# Patient Record
Sex: Female | Born: 1935 | Race: White | Hispanic: No
Health system: Southern US, Community
[De-identification: ages and names within clinical notes are randomized; demographics above are authoritative.]

---

## 2005-04-05 ENCOUNTER — Ambulatory Visit: Payer: Self-pay | Admitting: Internal Medicine

## 2009-08-09 ENCOUNTER — Inpatient Hospital Stay: Payer: Self-pay | Admitting: Internal Medicine

## 2011-02-17 IMAGING — CR DG CHEST 1V PORT
1 series · 1 of 1 positions shown · non-contrast
Comparison: none

REASON FOR EXAM: cp
COMMENTS:

PROCEDURE:     DXR - DXR PORTABLE CHEST SINGLE VIEW  - August 09, 2009  [DATE]
RESULT:     Comparison: None

[view not recorded]
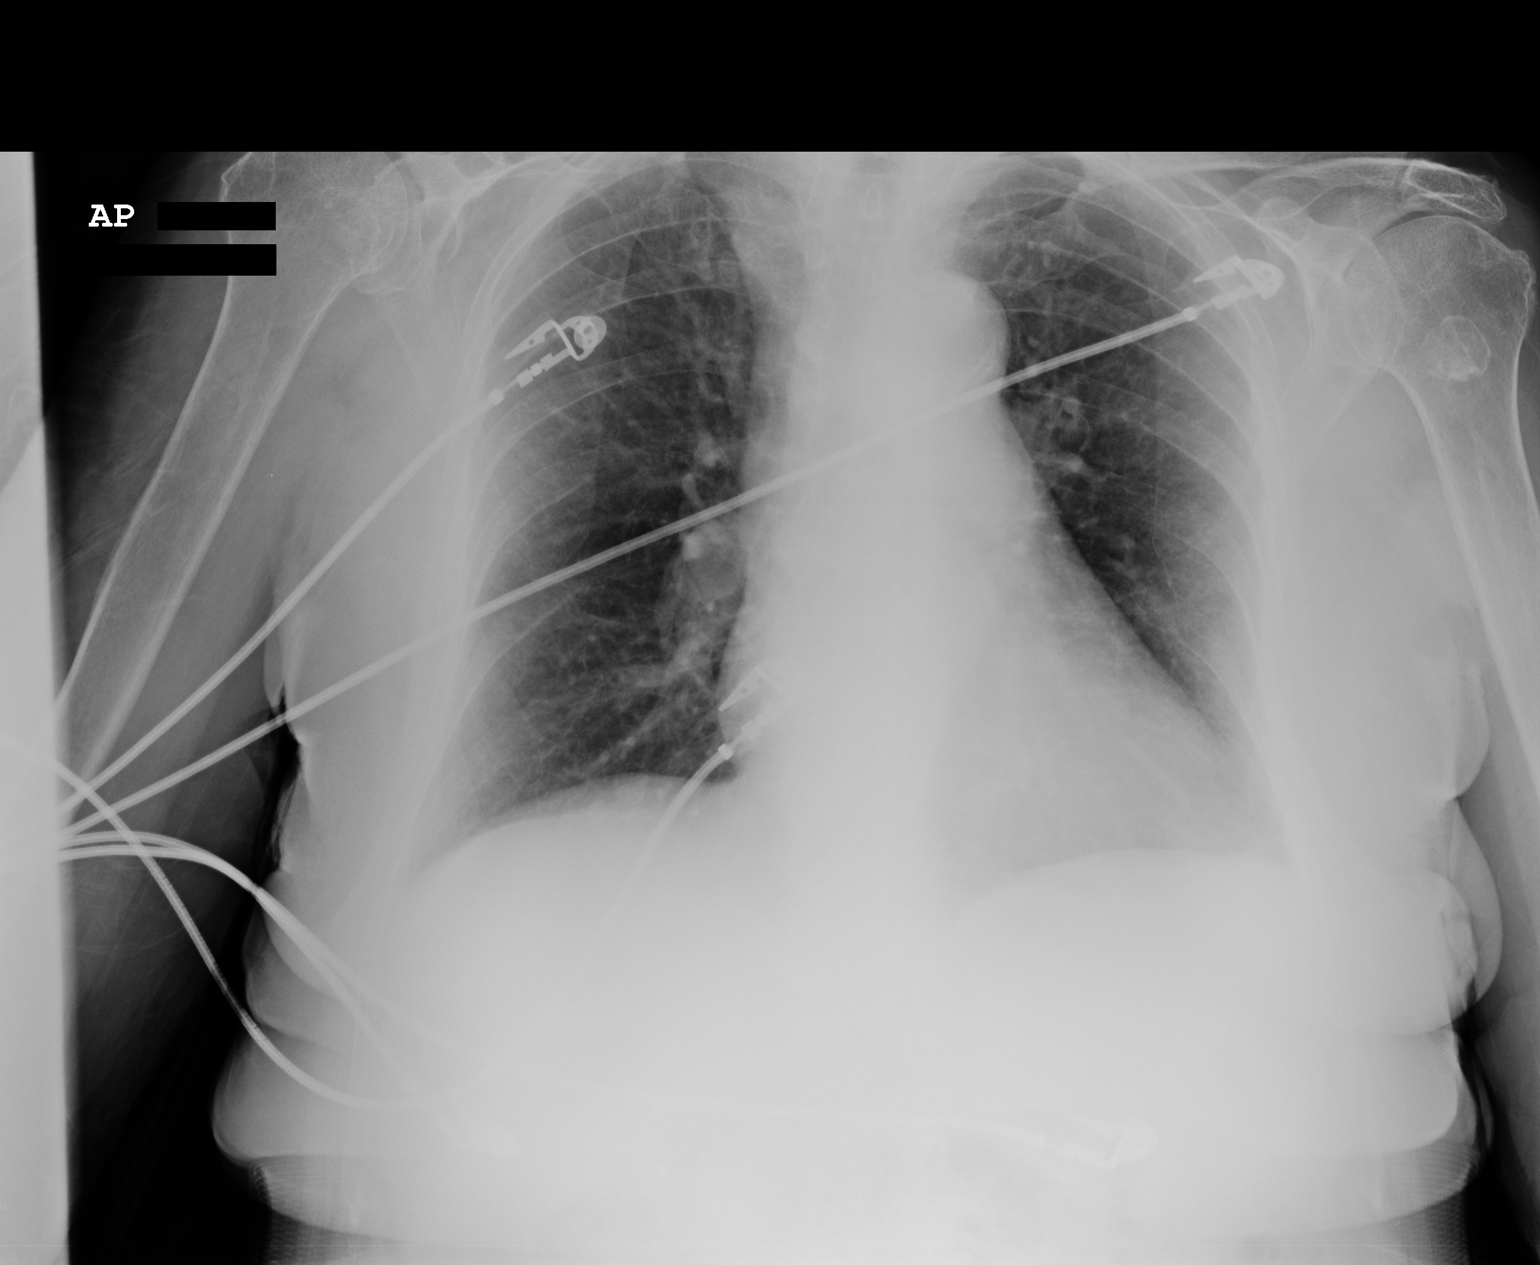

[1 of 1 positions shown; findings below may reference images not displayed]

FINDINGS: Single portable AP chest radiograph is provided. There is no focal
parenchymal opacity, pleural effusion, or pneumothorax. Normal
cardiomediastinal silhouette. The osseous structures are unremarkable.
IMPRESSION: No acute disease of the chest.

## 2011-02-17 IMAGING — CT CT CHEST-ABD W/ CM
1 of 3 series · 14 of 31 positions shown, 19 images · IV contrast (APPLIED)
Comparison: none

REASON FOR EXAM: (1) chest pain radiating to back, eval for dissection;
(2) chest pain radiating
COMMENTS:

[Series 5: soft tissue · axial · 0.65mm/px · z∈[+895,+1255]mm · 14 of 140 slices shown, 19 images]
[im 10/140  mediastinal]
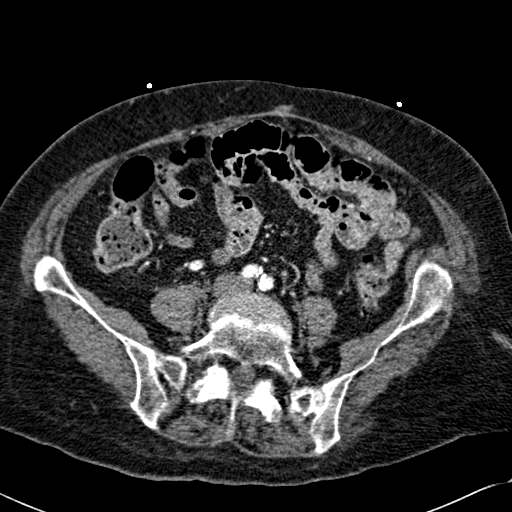
[im 10/140  bone]
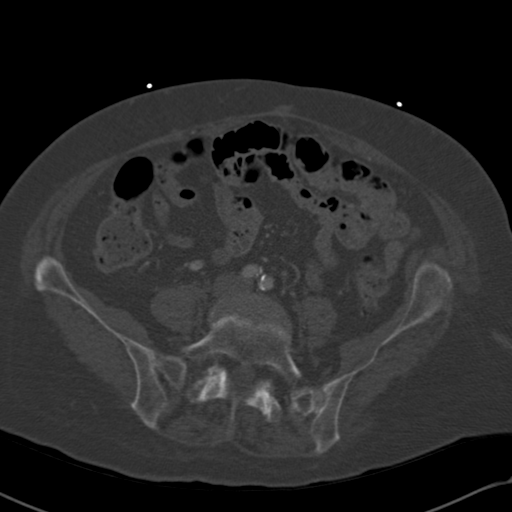
[im 19/140  mediastinal]
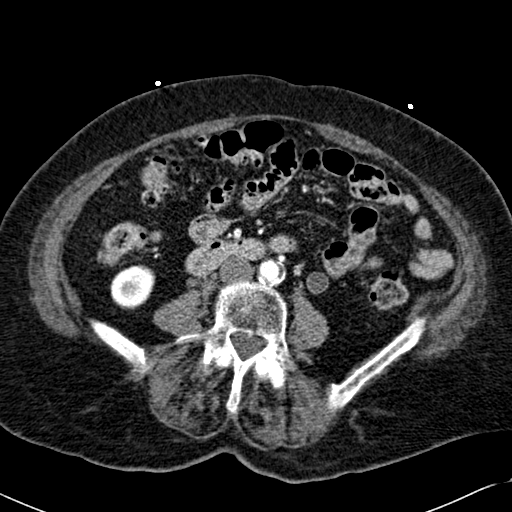
[im 38/140  mediastinal]
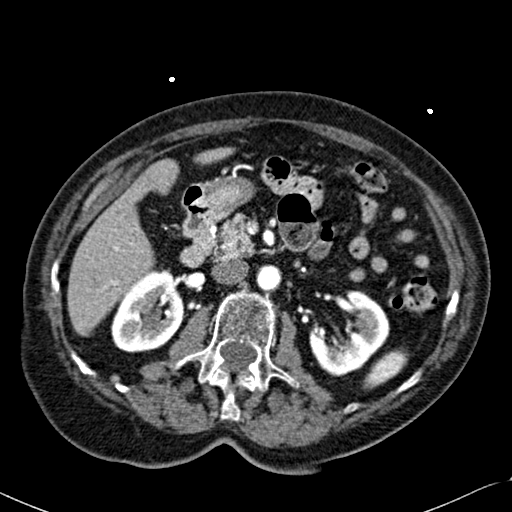
[im 47/140  mediastinal]
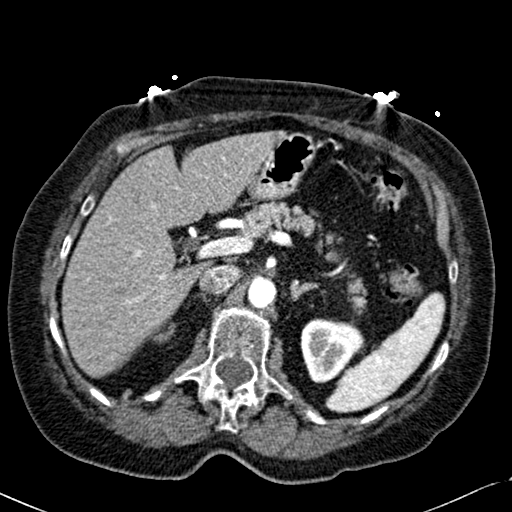
[im 56/140  mediastinal]
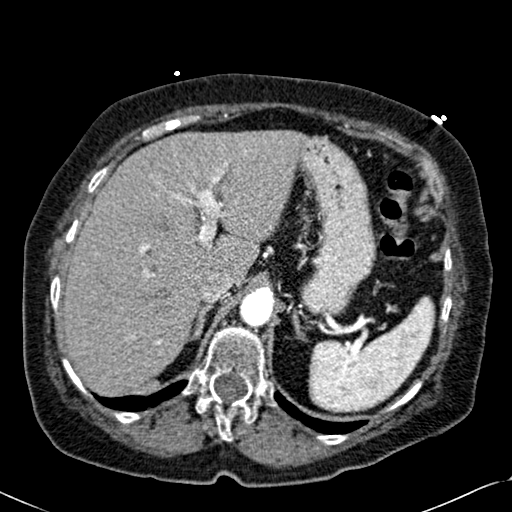
[im 65/140  mediastinal]
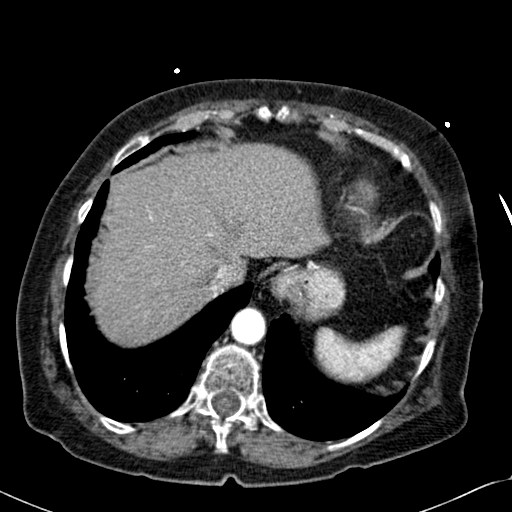
[im 70/140  mediastinal]
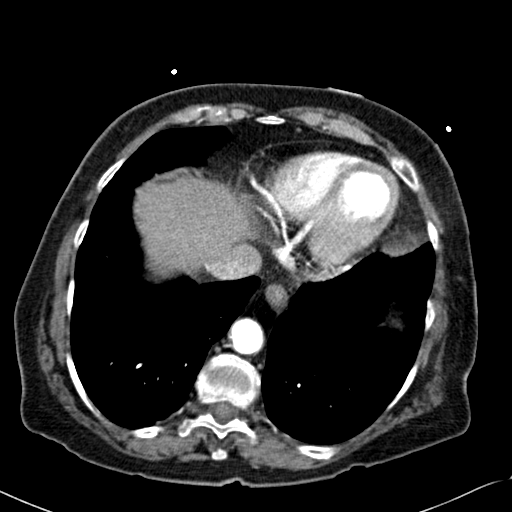
[im 75/140  mediastinal]
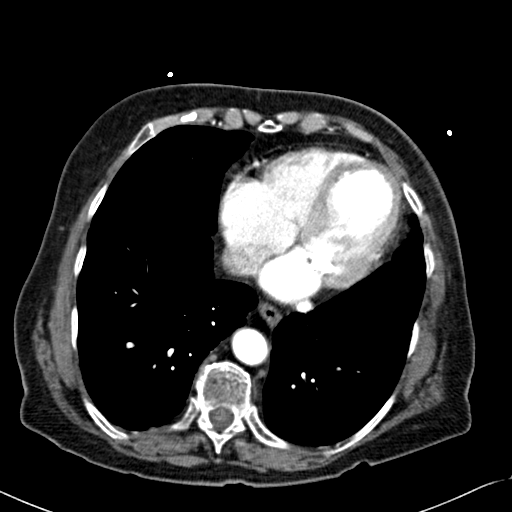
[im 84/140  mediastinal]
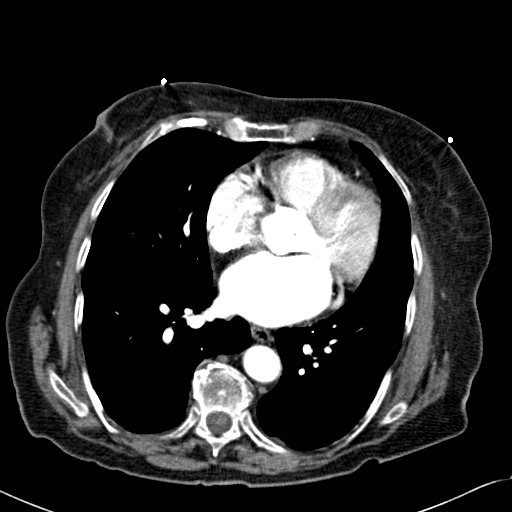
[im 84/140  bone]
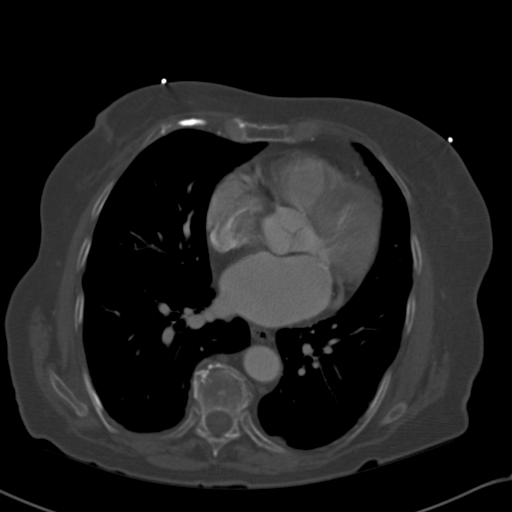
[im 93/140  mediastinal]
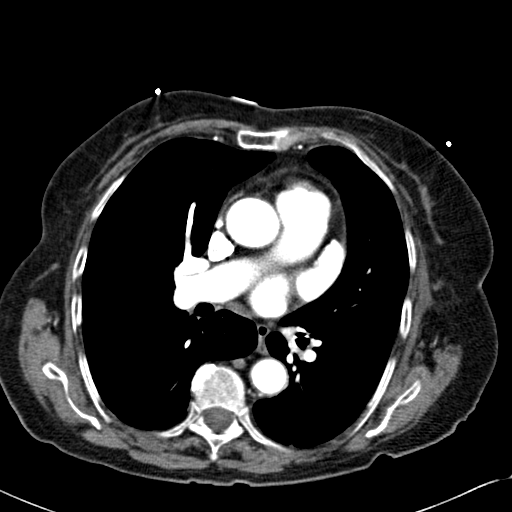
[im 102/140  mediastinal]
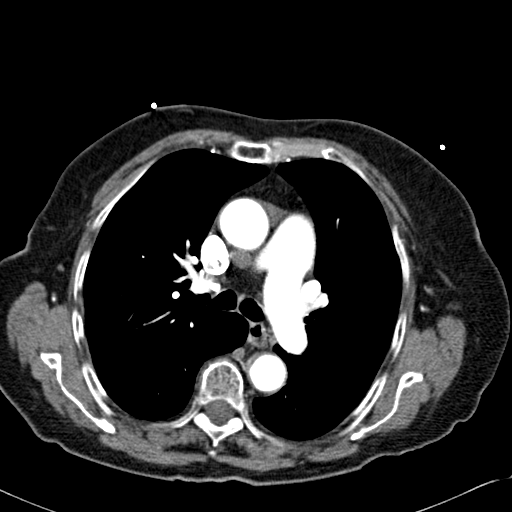
[im 102/140  lung]
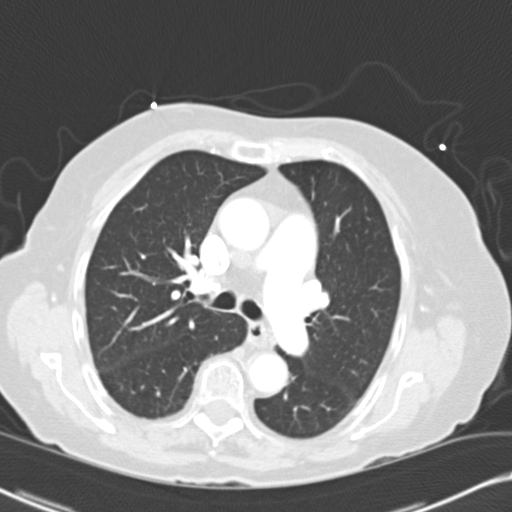
[im 112/140  lung]
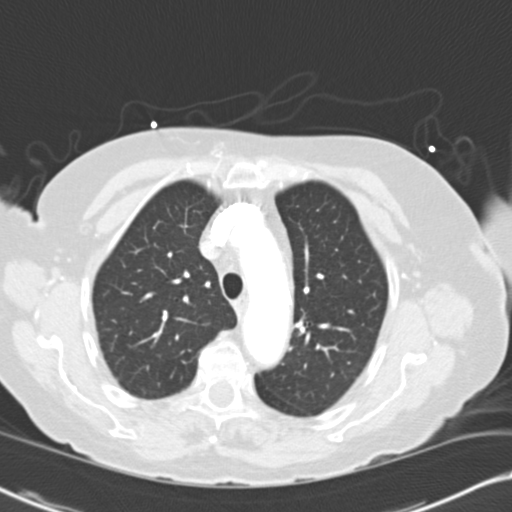
[im 121/140  mediastinal]
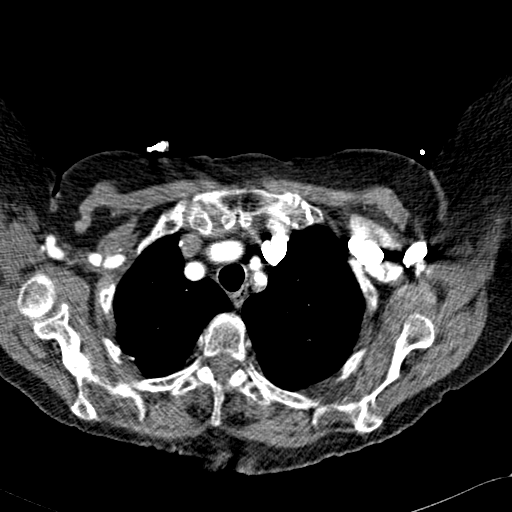
[im 121/140  lung]
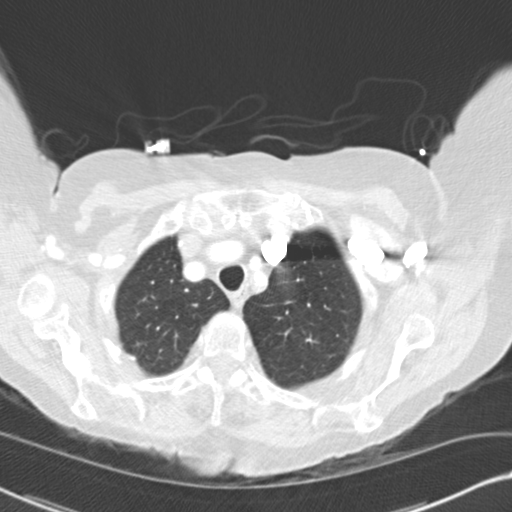
[im 130/140  mediastinal]
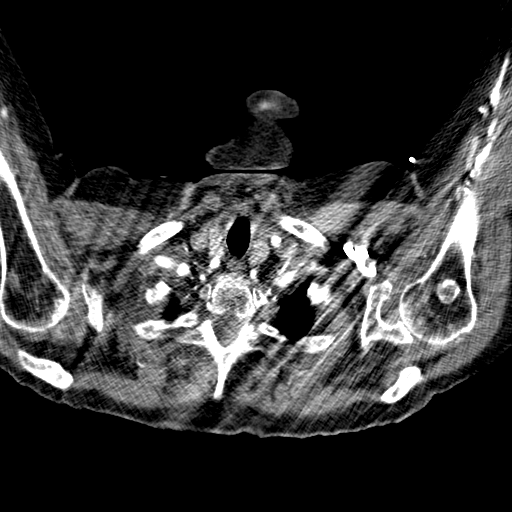
[im 130/140  lung]
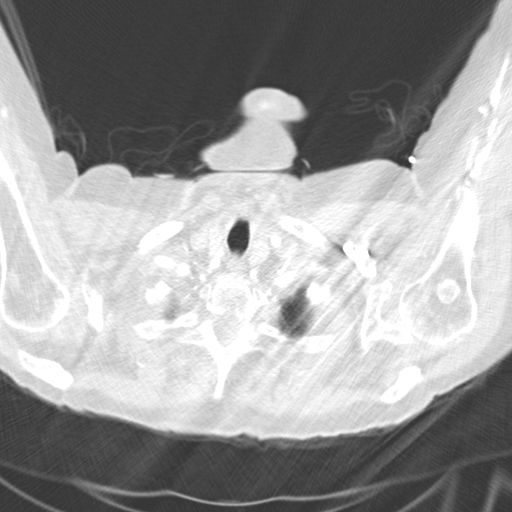

[14 of 31 positions shown; findings below may reference images not displayed]

PROCEDURE:     CT  - CT CHEST AND ABDOMEN W  - August 09, 2009 [DATE]

RESULT:     Axial CT scanning was performed through the chest and abdomen at
3 mm intervals and slice thicknesses following intravenous administration of
85 cc of Tsovue-AW8. Review of 3-dimensional reconstructed images was
performed separately on the webspace server monitor.

The caliber of the thoracic and abdominal aorta is normal. There is no
evidence of a false lumen. There is mural thrombus and calcification in the
infrarenal abdominal aorta. No dissection of this portion of the aorta
extending into the common iliac arteries is seen. The thoracic esophagus is
normal in caliber. There is a small hiatal hernia.

The cardiac chambers are top normal to mildly enlarged. There is no evidence
of a mediastinal hematoma. Contrast within the pulmonary arterial tree is
grossly normal. No pathologic sized mediastinal or hilar lymph nodes are
demonstrated. The retrosternal soft tissues appear normal. At lung window
settings I see no interstitial nor alveolar infiltrates nor evidence of a
pneumothorax. There are emphysematous changes bilaterally.
CONCLUSION: I do not see evidence of aortic dissection nor of aneurysm
involving the thoracic or abdominal aorta.

CT scan of the abdomen: The liver exhibits no focal mass nor ductal
dilation. The gallbladder is surgically absent. The spleen, nondistended
stomach, adrenal glands, and kidneys are normal in appearance. The
unopacified loops of small and large bowel are normal in appearance. There
is no evidence of ascites. The thoracic and lumbar vertebral bodies are
preserved in height.
IMPRESSION: 1. Please see the discussion above regarding findings in the thorax. There
is no evidence of thoracic or abdominal aortic dissection.
2. I do not see acute abnormality within the abdomen.

## 2013-10-15 ENCOUNTER — Ambulatory Visit: Payer: Self-pay | Admitting: Ophthalmology

## 2013-10-15 LAB — POTASSIUM: POTASSIUM: 3.7 mmol/L (ref 3.5–5.1)

## 2013-10-28 ENCOUNTER — Ambulatory Visit: Payer: Self-pay | Admitting: Ophthalmology

## 2014-01-19 ENCOUNTER — Ambulatory Visit: Payer: Self-pay

## 2014-01-19 ENCOUNTER — Emergency Department: Payer: Self-pay | Admitting: Emergency Medicine

## 2014-01-19 LAB — COMPREHENSIVE METABOLIC PANEL
ANION GAP: 13 (ref 7–16)
AST: 14 U/L — AB (ref 15–37)
Albumin: 3 g/dL — ABNORMAL LOW (ref 3.4–5.0)
Alkaline Phosphatase: 99 U/L
BUN: 22 mg/dL — ABNORMAL HIGH (ref 7–18)
Bilirubin,Total: 0.5 mg/dL (ref 0.2–1.0)
CALCIUM: 9.2 mg/dL (ref 8.5–10.1)
CO2: 25 mmol/L (ref 21–32)
Chloride: 101 mmol/L (ref 98–107)
Creatinine: 1.28 mg/dL (ref 0.60–1.30)
EGFR (African American): 42 — ABNORMAL LOW
EGFR (Non-African Amer.): 36 — ABNORMAL LOW
Glucose: 113 mg/dL — ABNORMAL HIGH (ref 65–99)
Osmolality: 282 (ref 275–301)
POTASSIUM: 3.8 mmol/L (ref 3.5–5.1)
SGPT (ALT): 19 U/L (ref 12–78)
Sodium: 139 mmol/L (ref 136–145)
Total Protein: 6.7 g/dL (ref 6.4–8.2)

## 2014-01-19 LAB — CBC
HCT: 36.8 % (ref 35.0–47.0)
HGB: 12.2 g/dL (ref 12.0–16.0)
MCH: 30.6 pg (ref 26.0–34.0)
MCHC: 33.1 g/dL (ref 32.0–36.0)
MCV: 92 fL (ref 80–100)
Platelet: 293 10*3/uL (ref 150–440)
RBC: 3.99 10*6/uL (ref 3.80–5.20)
RDW: 13 % (ref 11.5–14.5)
WBC: 12 10*3/uL — ABNORMAL HIGH (ref 3.6–11.0)

## 2014-01-19 LAB — PRO B NATRIURETIC PEPTIDE: B-Type Natriuretic Peptide: 1213 pg/mL — ABNORMAL HIGH (ref 0–450)

## 2014-01-19 LAB — TROPONIN I
Troponin-I: <0.02 ng/mL
Troponin-I: <0.02 ng/mL

## 2014-01-19 LAB — CK TOTAL AND CKMB (NOT AT ARMC)
CK, TOTAL: 32 U/L
CK-MB: 0.7 ng/mL (ref 0.5–3.6)

## 2014-01-19 LAB — LIPASE, BLOOD: Lipase: 201 U/L (ref 73–393)

## 2014-11-14 NOTE — Op Note (Signed)
PATIENT NAME:  Christine Dalton, Christine Dalton MR#:  [id number] DATE OF BIRTH:  [birth year]  DATE OF PROCEDURE:  10/28/2013   PREOPERATIVE DIAGNOSIS: Visually significant cataract of the right eye.   POSTOPERATIVE DIAGNOSIS: Visually significant cataract of the right eye.   OPERATIVE PROCEDURE: Cataract extraction by phacoemulsification with implant of intraocular lens to right eye.   SURGEON: Galen ManilaWilliam Emagene Merfeld, MD.   ANESTHESIA:  1. Managed anesthesia care.  2. Topical tetracaine drops followed by 2% Xylocaine jelly applied in the preoperative holding area.   COMPLICATIONS: None.   TECHNIQUE:  Stop and chop.  DESCRIPTION OF PROCEDURE: The patient was examined and consented in the preoperative holding area where the aforementioned topical anesthesia was applied to the right eye and then brought back to the Operating Room where the right eye was prepped and draped in the usual sterile ophthalmic fashion and a lid speculum was placed. A paracentesis was created with the side port blade and the anterior chamber was filled with viscoelastic. A near clear corneal incision was performed with the steel keratome. A continuous curvilinear capsulorrhexis was performed with a cystotome followed by the capsulorrhexis forceps. Hydrodissection and hydrodelineation were carried out with BSS on a blunt cannula. The lens was removed in a stop and chop technique and the remaining cortical material was removed with the irrigation-aspiration handpiece. The capsular bag was inflated with viscoelastic and the Tecnis ZCB00 23.5-diopter lens, serial number [id number] was placed in the capsular bag without complication. The remaining viscoelastic was removed from the eye with the irrigation-aspiration handpiece. The wounds were hydrated. The anterior chamber was flushed with Miostat and the eye was inflated to physiologic pressure. 0.1 mL of cefuroxime concentration 10 mg/mL was placed in the anterior chamber. The wounds were found to be  water tight. The eye was dressed with Vigamox. The patient was given protective glasses to wear throughout the day and a shield with which to sleep tonight. The patient was also given drops with which to begin a drop regimen today and will follow-up with me in one day.     ____________________________ Jerilee FieldWilliam L. Nene Aranas, MD wlp:dmm D: 10/28/2013 12:15:36 ET T: 10/28/2013 12:43:37 ET JOB#: [id number]  cc: Elania Crowl L. Abrey Bradway, MD, <Dictator> Jerilee FieldWILLIAM L Jeroline Wolbert MD ELECTRONICALLY SIGNED 11/06/2013 10:51

## 2015-07-30 IMAGING — CR DG CHEST 2V
1 series · 3 of 3 positions shown · non-contrast
Comparison: Chest CT and single view of the chest 06/09/2010.

CLINICAL DATA: Altered mental status.

EXAM:
CHEST  2 VIEW

[Series 1: x chest ap · 0.14mm/px · 3 of 3 slices shown]
[im 1/3]
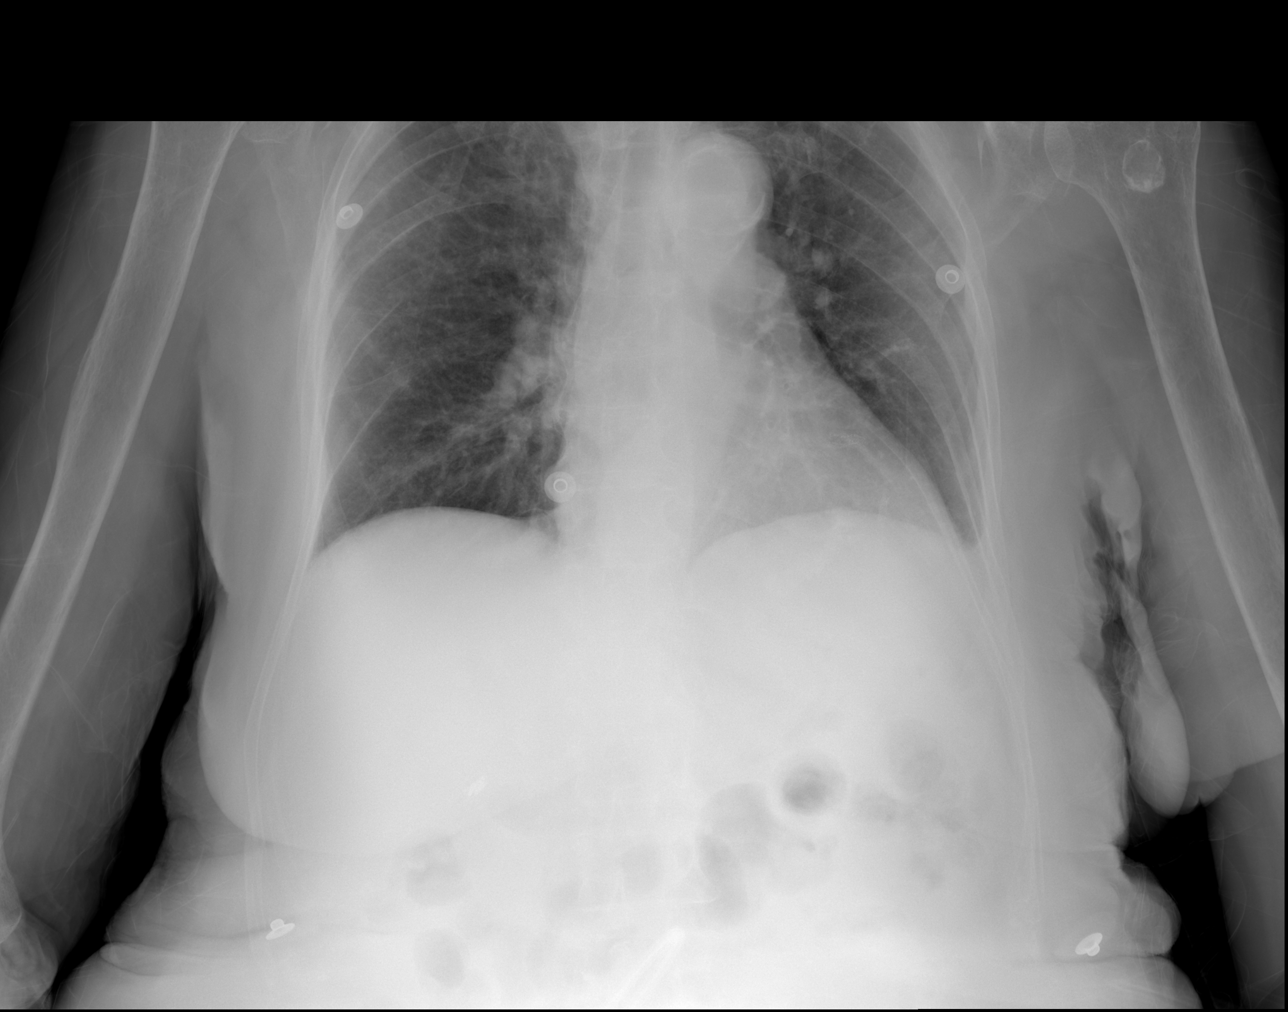
[im 2/3]
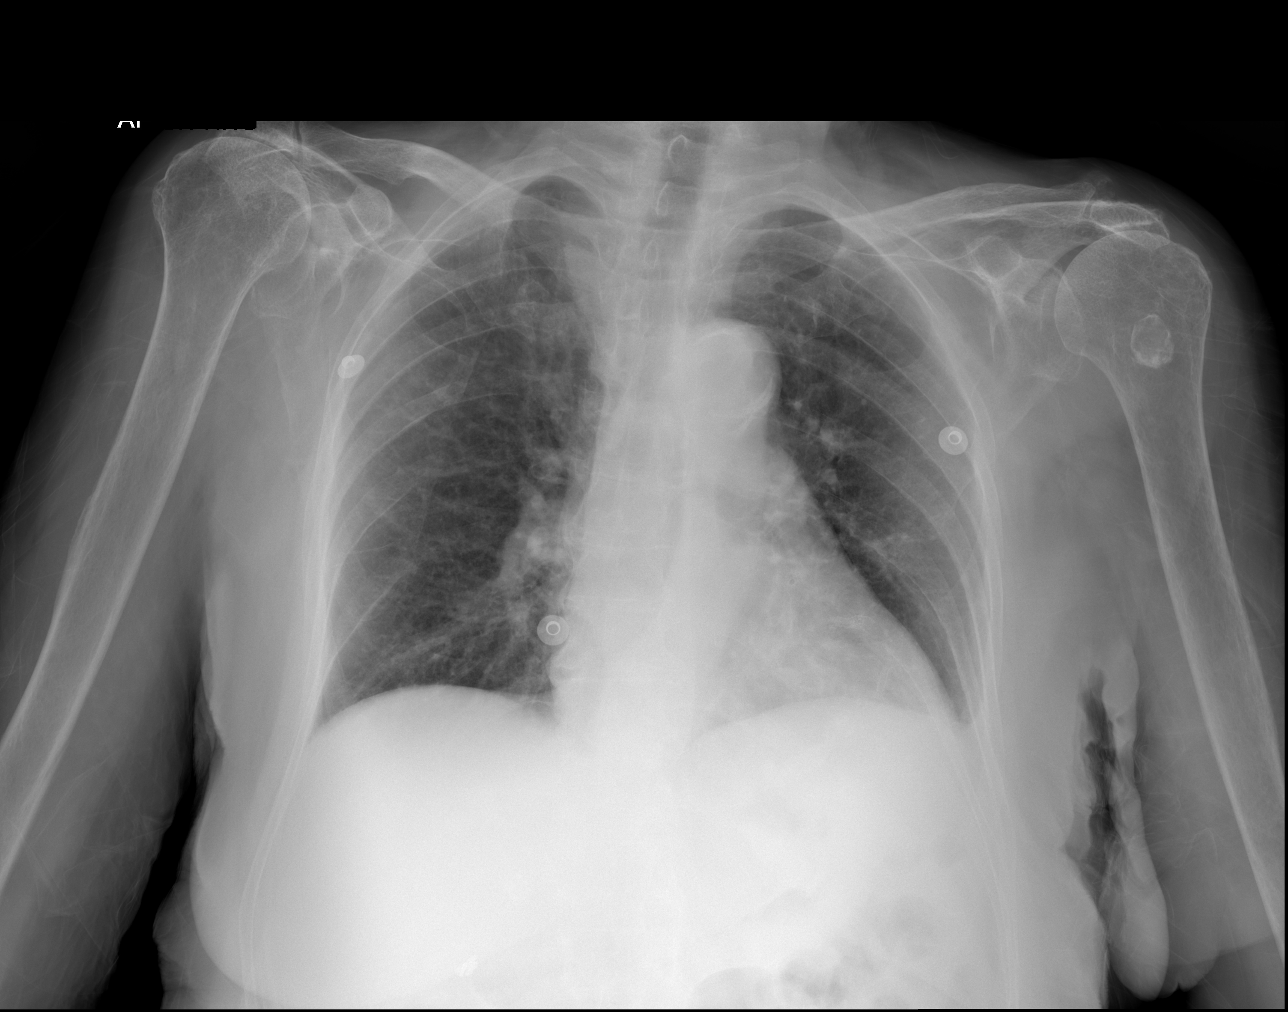
[im 3/3]
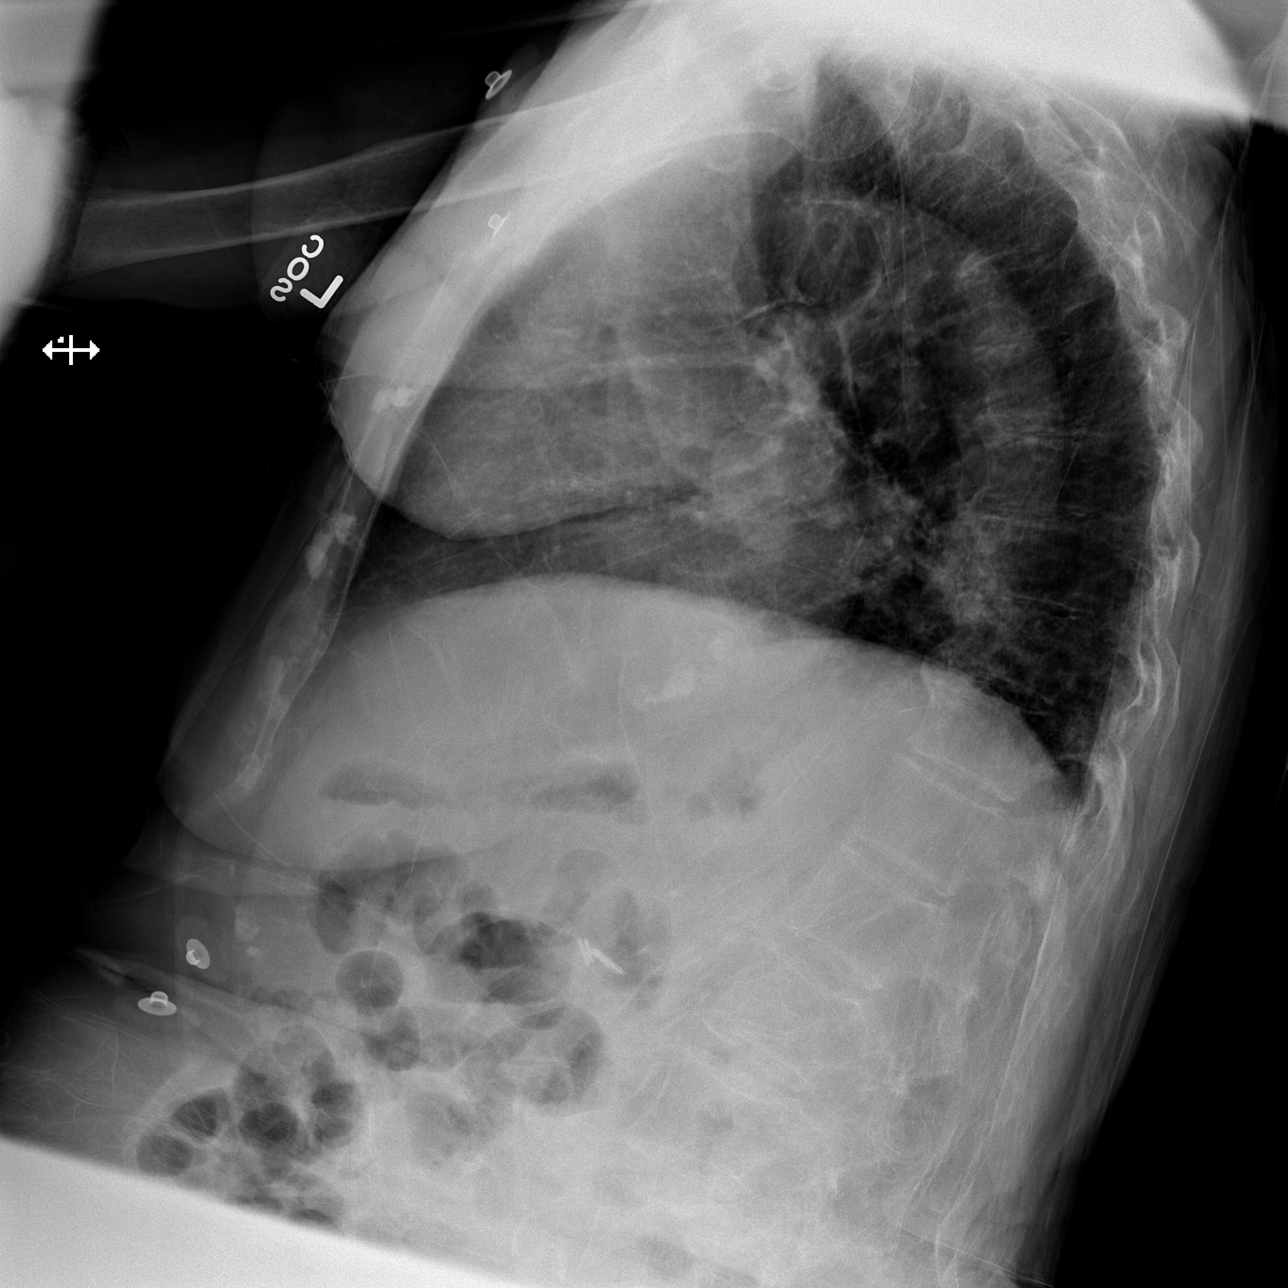

[3 of 3 positions shown; findings below may reference images not displayed]

FINDINGS: The lungs are clear. Heart size is normal. No pneumothorax or
pleural effusion. Well circumscribed lesion in the proximal left
humerus is stable in appearance and may represent a bone cyst.
IMPRESSION: No acute finding.
